# Patient Record
Sex: Female | Born: 1980 | Race: White | Hispanic: No | Marital: Married | Smoking: Never smoker
Health system: Southern US, Community
[De-identification: ages and names within clinical notes are randomized; demographics above are authoritative.]

## PROBLEM LIST (undated history)

## (undated) DIAGNOSIS — G43909 Migraine, unspecified, not intractable, without status migrainosus: Secondary | ICD-10-CM

## (undated) DIAGNOSIS — R011 Cardiac murmur, unspecified: Secondary | ICD-10-CM

## (undated) DIAGNOSIS — B009 Herpesviral infection, unspecified: Secondary | ICD-10-CM

---

## 2004-09-25 ENCOUNTER — Emergency Department: Payer: Self-pay | Admitting: Emergency Medicine

## 2009-10-10 ENCOUNTER — Ambulatory Visit: Payer: Self-pay | Admitting: Internal Medicine

## 2010-05-06 ENCOUNTER — Emergency Department: Payer: Self-pay | Admitting: Emergency Medicine

## 2010-05-07 ENCOUNTER — Ambulatory Visit: Payer: Self-pay | Admitting: Internal Medicine
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
## 2018-11-28 ENCOUNTER — Other Ambulatory Visit: Payer: Self-pay

## 2018-11-28 ENCOUNTER — Ambulatory Visit
Admission: EM | Admit: 2018-11-28 | Discharge: 2018-11-28 | Disposition: A | Discharge status: Home / Self Care | Payer: Managed Care, Other (non HMO) | Attending: Emergency Medicine | Admitting: Emergency Medicine

## 2018-11-28 ENCOUNTER — Encounter: Payer: Self-pay | Admitting: Gynecology

## 2018-11-28 DIAGNOSIS — H05232 Hemorrhage of left orbit: Secondary | ICD-10-CM

## 2018-11-28 DIAGNOSIS — S01112A Laceration without foreign body of left eyelid and periocular area, initial encounter: Secondary | ICD-10-CM | POA: Diagnosis not present

## 2018-11-28 DIAGNOSIS — Y92009 Unspecified place in unspecified non-institutional (private) residence as the place of occurrence of the external cause: Secondary | ICD-10-CM | POA: Diagnosis not present

## 2018-11-28 DIAGNOSIS — W19XXXA Unspecified fall, initial encounter: Secondary | ICD-10-CM | POA: Diagnosis not present

## 2018-11-28 HISTORY — DX: Migraine, unspecified, not intractable, without status migrainosus: G43.909

## 2018-11-28 HISTORY — DX: Herpesviral infection, unspecified: B00.9

## 2018-11-28 HISTORY — DX: Cardiac murmur, unspecified: R01.1

## 2018-11-28 NOTE — ED Provider Notes (Signed)
[facility]    CSN: [id number] Arrival date & time: 11/28/18  0809     History   Chief Complaint No chief complaint on file.   HPI JEANIFER DRAYTON is a 38 y.o. female.   HPI  39 year old female presents 2 days after she fell in the shower and hit the left side of her face sustaining a laceration over the left eyelid laterally.  He did not come in to be seen because she "did not want to".She has been reprimanded by her father and husband to be seen to make sure she had not sustained a very severe injury.  During the accident  had no loss of consciousness.  She states that she simply slipped and fell hitting the sink but broke her fall largely with her hands.  Since that time she has had no neurological symptoms.  She has had no blurring of vision loss of concentration memory changes personality changes, nausea or vomiting etc.  Is alert and oriented.  He has had a headache mostly yesterday        Past Medical History:  Diagnosis Date  . Heart murmur   . HSV infection   . Migraine headache     There are no active problems to display for this patient.   History reviewed. No pertinent surgical history.  OB History   No obstetric history on file.      Home Medications    Prior to Admission medications   Medication Sig Start Date End Date Taking? Authorizing Provider  ESTARYLLA 0.25-35 MG-MCG tablet  10/05/18  Yes [provider]  valACYclovir (VALTREX) 500 MG tablet Take by mouth. 11/15/18  Yes [provider]    Family History Family History  Problem Relation Age of Onset  . Healthy Mother   . Diabetes Father     Social History Social History   Tobacco Use  . Smoking status: Never Smoker  . Smokeless tobacco: Never Used  Substance Use Topics  . Alcohol use: Yes    Comment: glass a wine weekly  . Drug use: Never     Allergies   Other   Review of Systems Review of Systems  Constitutional: Negative for activity  change, appetite change, chills, fatigue and fever.  Gastrointestinal: Negative for nausea and vomiting.  Neurological: Positive for headaches.  All other systems reviewed and are negative.    Physical Exam Triage Vital Signs ED Triage Vitals  Enc Vitals Group     BP 11/28/18 0823 (!) 120/55     Pulse Rate 11/28/18 0823 98     Resp 11/28/18 0823 16     Temp 11/28/18 0823 98.2 F (36.8 C)     Temp Source 11/28/18 0823 Oral     SpO2 11/28/18 0823 100 %     Weight 11/28/18 0821 165 lb (74.8 kg)     Height 11/28/18 0821 5\' 2"  (1.575 m)     Head Circumference --      Peak Flow --      Pain Score 11/28/18 0821 3     Pain Loc --      Pain Edu? --      Excl. in GC? --    No data found.  Updated Vital Signs BP (!) 120/55 (BP Location: Left Arm)   Pulse 98   Temp 98.2 F (36.8 C) (Oral)   Resp 16   Ht 5\' 2"  (1.575 m)   Wt 165 lb (74.8 kg)   LMP 11/28/2018  SpO2 100%   BMI 30.18 kg/m   Visual Acuity Right Eye Distance:   Left Eye Distance:   Bilateral Distance:    Right Eye Near:   Left Eye Near:    Bilateral Near:     Physical Exam Vitals signs and nursing note reviewed.  Constitutional:      General: She is not in acute distress.    Appearance: Normal appearance. She is not ill-appearing, toxic-appearing or diaphoretic.  HENT:     Head: Normocephalic.     Comments: She has sustained a small 1 cm laceration over the lateral aspect of her upper eyelid on the left.  He has mild periorbital ecchymosis and hematoma.  Refer to photographs for details    Right Ear: Tympanic membrane, ear canal and external ear normal. There is no impacted cerumen.     Left Ear: Tympanic membrane, ear canal and external ear normal. There is no impacted cerumen.     Nose: Nose normal.     Mouth/Throat:     Mouth: Mucous membranes are moist.  Eyes:     General:        Right eye: No discharge.        Left eye: No discharge.     Extraocular Movements: Extraocular movements intact.      Conjunctiva/sclera: Conjunctivae normal.     Pupils: Pupils are equal, round, and reactive to light.  Neck:     Musculoskeletal: Normal range of motion and neck supple. No neck rigidity or muscular tenderness.  Musculoskeletal: Normal range of motion.  Skin:    General: Skin is warm and dry.     Findings: Bruising present.  Neurological:     General: No focal deficit present.     Mental Status: She is alert and oriented to person, place, and time.     Cranial Nerves: No cranial nerve deficit.     Sensory: No sensory deficit.     Motor: No weakness.     Coordination: Coordination normal.     Gait: Gait normal.     Deep Tendon Reflexes: Reflexes normal.  Psychiatric:        Mood and Affect: Mood normal.        Behavior: Behavior normal.        Thought Content: Thought content normal.        Judgment: Judgment normal.          UC Treatments / Results  Labs (all labs ordered are listed, but only abnormal results are displayed) Labs Reviewed - No data to display  EKG None  Radiology No results found.  Procedures Procedures (including critical care time)  Medications Ordered in UC Medications - No data to display  Initial Impression / Assessment and Plan / UC Course  I have reviewed the triage vital signs and the nursing notes.  Pertinent labs & imaging results that were available during my care of the patient were reviewed by me and considered in my medical decision making (see chart for details).   The patient's physical exam is very reassuring.  Laceration she sustained over the eyelid is too old to close at this point. Will Heal by secondary intention.  Later she may wish to see plastic or ENT for repair if the resulting scar is not acceptable to her.  I have given her information on head injury care and for the laceration.  He has any worsening she should consider going to the emergency room otherwise he should follow-up with her  primary care physician   Final  Clinical Impressions(s) / UC Diagnoses   Final diagnoses:  Eyelid laceration, left, initial encounter  Periorbital hematoma of left eye  Fall at home, initial encounter     Discharge Instructions     Apply ice 20 minutes out of every 2 hours 4-5 times daily for comfort.    ED Prescriptions    None     Controlled Substance Prescriptions  Controlled Substance Registry consulted? Not Applicable   Lutricia Feil, PA-C 11/28/18 [id number]

## 2018-11-28 NOTE — Discharge Instructions (Addendum)
Apply ice 20 minutes out of every 2 hours 4-5 times daily for comfort.  °

## 2018-11-28 NOTE — ED Triage Notes (Signed)
Per patient c/o x 2 days fell in the showers and hit left side face. Patient with laceration over her left eyelid. Patient denies any headache today or did she had a concussion.

## 2020-01-02 ENCOUNTER — Other Ambulatory Visit: Payer: Self-pay

## 2020-01-02 ENCOUNTER — Ambulatory Visit: Payer: 59 | Attending: Internal Medicine

## 2020-01-02 DIAGNOSIS — Z23 Encounter for immunization: Secondary | ICD-10-CM

## 2020-01-02 NOTE — Progress Notes (Signed)
   Covid-19 Vaccination Clinic  Name:  Loretta Bishop    MRN: [id number] DOB: 02-13-1981  01/02/2020  [name] was observed post Covid-19 immunization for 15 minutes without incident. She was provided with Vaccine Information Sheet and instruction to access the V-Safe system.   [name] was instructed to call 911 with any severe reactions post vaccine: Marland Kitchen Difficulty breathing  . Swelling of face and throat  . A fast heartbeat  . A bad rash all over body  . Dizziness and weakness   Immunizations Administered    Name Date Dose VIS Date Route   Pfizer COVID-19 Vaccine 01/02/2020  4:06 PM 0.3 mL 09/23/2019 Intramuscular   Manufacturer: [employer], [employer]   Lot: [id number]   NDC: [address]-8

## 2020-01-23 ENCOUNTER — Ambulatory Visit: Payer: 59 | Attending: Internal Medicine

## 2020-01-23 DIAGNOSIS — Z23 Encounter for immunization: Secondary | ICD-10-CM

## 2020-01-23 NOTE — Progress Notes (Signed)
   Covid-19 Vaccination Clinic  Name:  Loretta Bishop    MRN: [id number] DOB: 11-Mar-1981  01/23/2020  [name] was observed post Covid-19 immunization for 15 minutes without incident. She was provided with Vaccine Information Sheet and instruction to access the V-Safe system.   [name] was instructed to call 911 with any severe reactions post vaccine: Marland Kitchen Difficulty breathing  . Swelling of face and throat  . A fast heartbeat  . A bad rash all over body  . Dizziness and weakness   Immunizations Administered    Name Date Dose VIS Date Route   Pfizer COVID-19 Vaccine 01/23/2020  3:48 PM 0.3 mL 09/23/2019 Intramuscular   Manufacturer: [employer], [employer]   Lot: [id number]   NDC: [address]-8
